# Patient Record
Sex: Male | Born: 1960 | Race: White | Hispanic: No | Marital: Married | Smoking: Never smoker
Health system: Southern US, Community
[De-identification: ages and names within clinical notes are randomized; demographics above are authoritative.]

---

## 2007-02-09 ENCOUNTER — Ambulatory Visit: Payer: Self-pay | Admitting: Urology

## 2008-02-06 ENCOUNTER — Ambulatory Visit: Payer: Self-pay | Admitting: Urology

## 2009-02-11 ENCOUNTER — Ambulatory Visit: Payer: Self-pay | Admitting: Urology

## 2011-02-09 ENCOUNTER — Ambulatory Visit: Payer: Self-pay | Admitting: Urology

## 2012-06-30 ENCOUNTER — Emergency Department: Payer: Self-pay | Admitting: *Deleted

## 2013-05-13 DIAGNOSIS — N529 Male erectile dysfunction, unspecified: Secondary | ICD-10-CM | POA: Insufficient documentation

## 2013-05-13 DIAGNOSIS — R82991 Hypocitraturia: Secondary | ICD-10-CM | POA: Insufficient documentation

## 2013-05-13 DIAGNOSIS — N138 Other obstructive and reflux uropathy: Secondary | ICD-10-CM | POA: Insufficient documentation

## 2013-05-13 DIAGNOSIS — N200 Calculus of kidney: Secondary | ICD-10-CM | POA: Insufficient documentation

## 2013-05-14 ENCOUNTER — Ambulatory Visit: Payer: Self-pay | Admitting: Urology

## 2015-12-03 DIAGNOSIS — Z1211 Encounter for screening for malignant neoplasm of colon: Secondary | ICD-10-CM | POA: Insufficient documentation

## 2016-08-30 ENCOUNTER — Other Ambulatory Visit: Payer: Self-pay | Admitting: Neurology

## 2016-08-30 DIAGNOSIS — R251 Tremor, unspecified: Secondary | ICD-10-CM

## 2016-09-09 ENCOUNTER — Ambulatory Visit: Payer: Self-pay

## 2016-09-20 ENCOUNTER — Ambulatory Visit
Admission: RE | Admit: 2016-09-20 | Discharge: 2016-09-20 | Disposition: A | Discharge status: Home / Self Care | Payer: BLUE CROSS/BLUE SHIELD | Source: Ambulatory Visit | Attending: Neurology | Admitting: Neurology

## 2016-09-20 DIAGNOSIS — R251 Tremor, unspecified: Secondary | ICD-10-CM | POA: Diagnosis not present

## 2016-09-20 MED ORDER — GADOBENATE DIMEGLUMINE 529 MG/ML IV SOLN
18.0000 mL | Freq: Once | INTRAVENOUS | Status: AC | PRN
  Administered 2016-09-20: 18 mL via INTRAVENOUS

## 2017-02-01 DIAGNOSIS — M79673 Pain in unspecified foot: Secondary | ICD-10-CM | POA: Insufficient documentation

## 2017-02-17 DIAGNOSIS — G20 Parkinson's disease: Secondary | ICD-10-CM | POA: Insufficient documentation

## 2017-06-14 DIAGNOSIS — G20 Parkinson's disease: Secondary | ICD-10-CM | POA: Insufficient documentation

## 2017-07-19 ENCOUNTER — Ambulatory Visit (INDEPENDENT_AMBULATORY_CARE_PROVIDER_SITE_OTHER): Payer: BLUE CROSS/BLUE SHIELD | Admitting: Podiatry

## 2017-07-19 ENCOUNTER — Encounter: Payer: Self-pay | Admitting: Podiatry

## 2017-07-19 ENCOUNTER — Ambulatory Visit (INDEPENDENT_AMBULATORY_CARE_PROVIDER_SITE_OTHER): Payer: BLUE CROSS/BLUE SHIELD

## 2017-07-19 VITALS — BP 125/86 | HR 65 | Temp 98.2°F | Resp 14

## 2017-07-19 DIAGNOSIS — M7751 Other enthesopathy of right foot: Secondary | ICD-10-CM | POA: Diagnosis not present

## 2017-07-19 DIAGNOSIS — M779 Enthesopathy, unspecified: Secondary | ICD-10-CM | POA: Diagnosis not present

## 2017-07-19 DIAGNOSIS — S92901K Unspecified fracture of right foot, subsequent encounter for fracture with nonunion: Secondary | ICD-10-CM | POA: Diagnosis not present

## 2017-07-19 DIAGNOSIS — M778 Other enthesopathies, not elsewhere classified: Secondary | ICD-10-CM

## 2017-07-19 NOTE — Progress Notes (Addendum)
   Subjective:    Patient ID: Jordan Luna, male    DOB: 1961-09-16, 56 y.o.   MRN: [id number]  HPI: He presents today with his wife with a chief complaint of a painful right foot times the past 7-8 months. He states that toward the end of December 2017 he went on an excessively long run and developed pain in the first metatarsal in the midfoot area right side. He states that he continue worsen over a few days and he visited the emergency walk-in clinic. X-rays were taken demonstrating a stress fracture at the base of the first metatarsal. He was placed in a Cam Walker for 6-8 weeks and then was instructed to follow-up with orthopedics. He saw orthopedics who x-rayed the foot again demonstrating that it was probably not a stress fracture in the first place. He then continued his exercise regimen and the pain continued to nagging. He states that he takes ibuprofen for pain as prescribed by orthopedics. He also saw another doctor at her no clinic. He has been recently diagnosed with Parkinson's disease.    Review of Systems  Neurological: Positive for tremors.  All other systems reviewed and are negative.      Objective:   Physical Exam: He presents today with an antalgic gait right. I have reviewed his past medical history medications allergy surgery social history and review of systems. No apparent distress. Pulses are strongly palpable. Neurologic sensorium is intact. Deep tendon reflexes are intact. Muscle strength is normal. Orthopedic evaluation demonstrates an obvious flatfoot deformity on the right side as compared to the left side. He states that he used to not be like this. He does have a mild elevated first metatarsal. He does have pain around a hypertrophic first metatarsal medial cuneiform joint. This is painful on palpation. He has good range of motion of the rear foot and the forefoot.  Radiographs taken today demonstrated osseously mature individual with what appears to be an old  fracture at the base of the wrist metatarsal with a possible nonunion. There is also hypertrophic bone growth very much similar to a hypertrophic nonunion is bone growth is dorsal medially and plantar. This does appear to be a fracture in particular and intra-articular fracture. No other osseous abnormalities are noted.        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  Plan: Discussed etiology pathology conservative versus surgical therapies at this point we are requesting an MRI to evaluate hypertrophic nonunion. This is a surgical consideration since all conservative therapies have failed to render this patient asymptomatic.  Once the MRI is performed we will have him back to discuss the results at that time we will start him on prednisone and perform an injection to the first metatarsal medial cuneiform joint area. He will be leaving for [place] in early September and November [place].

## 2017-08-01 ENCOUNTER — Telehealth: Payer: Self-pay | Admitting: Podiatry

## 2017-08-01 NOTE — Telephone Encounter (Signed)
I saw Dr. Al CorpusHyatt in the [place] office about two weeks ago. He said the next plan of action was for me to get an MRI. I have not heard anything from anyone and I expected it to be done by now. Please call me back by the end of the day at [phone]. Thank you.

## 2017-08-03 NOTE — Telephone Encounter (Signed)
Returned patient's call, left message informing him that I have been trying to get intouch with his insurance company and have left several messages for [employer] [place] to call back with no response.  I informed him that I will call him once Berkley Harveyauth has been obtained.

## 2017-08-10 ENCOUNTER — Telehealth: Payer: Self-pay

## 2017-08-10 NOTE — Telephone Encounter (Signed)
Per [employer] [place], MRI has been approved from 08/10/17 to 08/25/17   Auth # [id number] Patient was notified via voice mail to call [facility] scheduling and [facility].

## 2017-08-24 ENCOUNTER — Ambulatory Visit
Admission: RE | Admit: 2017-08-24 | Discharge: 2017-08-24 | Disposition: A | Discharge status: Home / Self Care | Payer: BLUE CROSS/BLUE SHIELD | Source: Ambulatory Visit | Attending: Podiatry | Admitting: Podiatry

## 2017-08-24 DIAGNOSIS — M19071 Primary osteoarthritis, right ankle and foot: Secondary | ICD-10-CM | POA: Insufficient documentation

## 2017-08-24 DIAGNOSIS — M779 Enthesopathy, unspecified: Secondary | ICD-10-CM | POA: Insufficient documentation

## 2017-08-24 DIAGNOSIS — M65871 Other synovitis and tenosynovitis, right ankle and foot: Secondary | ICD-10-CM | POA: Insufficient documentation

## 2017-08-24 DIAGNOSIS — R609 Edema, unspecified: Secondary | ICD-10-CM | POA: Diagnosis not present

## 2017-08-24 DIAGNOSIS — S92901K Unspecified fracture of right foot, subsequent encounter for fracture with nonunion: Secondary | ICD-10-CM | POA: Diagnosis present

## 2017-08-30 ENCOUNTER — Ambulatory Visit (INDEPENDENT_AMBULATORY_CARE_PROVIDER_SITE_OTHER): Payer: BLUE CROSS/BLUE SHIELD | Admitting: Podiatry

## 2017-08-30 ENCOUNTER — Encounter: Payer: Self-pay | Admitting: Podiatry

## 2017-08-30 DIAGNOSIS — M779 Enthesopathy, unspecified: Secondary | ICD-10-CM

## 2017-08-30 MED ORDER — MELOXICAM 15 MG PO TABS: 15.0000 mg | ORAL_TABLET | Freq: Every day | ORAL | 3 refills | Status: DC

## 2017-08-30 MED ORDER — METHYLPREDNISOLONE 4 MG PO TBPK: ORAL_TABLET | ORAL | 0 refills | Status: DC

## 2017-08-30 NOTE — Progress Notes (Signed)
He presents today for follow-up of his MRI report.  Objective: Vital signs are stable he is alert and oriented 3. Pulses are palpable. He still has pain on palpation of the first metatarsal medial cuneiforms joint. Radiographs confirm osteoarthritic change of this joint.  Assessment: Traumatic degenerative joint disease first metatarsal medial cuneiform joint right foot.  Plan: At this point due to the capsulitis from the degenerative joint disease I injected around the joint today with Kenalog and local anesthetic. Placed him on a Medrol Dosepak to be followed by meloxicam. We discussed appropriate shoe gear stretching exercises ice therapy. Discussed need for surgical intervention consisting of a Lapidus fusion. More the likely will also have to perform a second metatarsal osteotomy to slide back little bit since it does appear to be long. I will follow-up with him in a month after he returns from [place].

## 2017-10-11 ENCOUNTER — Ambulatory Visit: Payer: BLUE CROSS/BLUE SHIELD | Admitting: Podiatry

## 2017-10-23 ENCOUNTER — Ambulatory Visit: Payer: BLUE CROSS/BLUE SHIELD | Admitting: Podiatry

## 2017-10-23 ENCOUNTER — Encounter: Payer: Self-pay | Admitting: Podiatry

## 2017-10-23 DIAGNOSIS — M19079 Primary osteoarthritis, unspecified ankle and foot: Secondary | ICD-10-CM

## 2017-10-23 DIAGNOSIS — M779 Enthesopathy, unspecified: Secondary | ICD-10-CM

## 2017-10-23 NOTE — Progress Notes (Signed)
He presents today states that his foot is doing very well he states this just recently started to bother him again as he refers to the area of arthritis of the first metatarsal medial cuneiform articulation.  Objective: No pain on palpation or range of motion of the articular site in question.  Assessment: Resolving capsulitis osteoarthritic changes still prominent.  Plan: Follow up with him in January for another injection if necessary.

## 2017-11-16 ENCOUNTER — Other Ambulatory Visit: Payer: Self-pay

## 2017-11-16 MED ORDER — MELOXICAM 15 MG PO TABS: 15.0000 mg | ORAL_TABLET | Freq: Every day | ORAL | 1 refills | Status: DC

## 2017-11-16 NOTE — Telephone Encounter (Signed)
Pharmacy refill request for 90 day supply of Meloxicam.  Per Dr. Al CorpusHyatt, ok to refill for 90 days.  New rx has been sent to pharmacy

## 2017-12-06 ENCOUNTER — Ambulatory Visit: Payer: BLUE CROSS/BLUE SHIELD | Admitting: Podiatry

## 2017-12-06 ENCOUNTER — Encounter: Payer: Self-pay | Admitting: Podiatry

## 2017-12-06 DIAGNOSIS — M779 Enthesopathy, unspecified: Secondary | ICD-10-CM

## 2017-12-06 DIAGNOSIS — M7751 Other enthesopathy of right foot: Secondary | ICD-10-CM

## 2017-12-06 DIAGNOSIS — M778 Other enthesopathies, not elsewhere classified: Secondary | ICD-10-CM

## 2017-12-06 NOTE — Progress Notes (Signed)
He presents today for follow-up of foot pain states that is about the same the last injection is starting to wear off at this point so would like to consider another injection if possible.  Objective: Vital signs are stable he is alert and oriented x3 traumatic injury to the first metatarsal medial cuneiform joint has left arthritis and capsulitis present.  Assessment: Capsulitis osteoarthritis first metatarsal cuneiform joint.  Plan: Injected the first intermetatarsal space proximally today with 20 mg of Kenalog 5 mg of Marcaine to the point of maximal tenderness after sterile Betadine skin prep and verbal confirmation.  I will follow-up with him on an as-needed basis or in April.

## 2018-02-28 ENCOUNTER — Ambulatory Visit: Payer: BLUE CROSS/BLUE SHIELD | Admitting: Podiatry

## 2018-02-28 ENCOUNTER — Encounter: Payer: Self-pay | Admitting: Podiatry

## 2018-02-28 DIAGNOSIS — M779 Enthesopathy, unspecified: Secondary | ICD-10-CM | POA: Diagnosis not present

## 2018-02-28 DIAGNOSIS — M778 Other enthesopathies, not elsewhere classified: Secondary | ICD-10-CM

## 2018-02-28 NOTE — Progress Notes (Signed)
He presents today for follow-up of pain to the arthritic joint of the first metatarsal medial cuneiform joint as well as to his second metatarsal phalangeal joint.  He states the injection has seemingly not lasted as long as the initial injection did.  He states that he is leaving for [place] this afternoon and would like another injection but knows that he is going to have to have surgery to correct this sometime in late summer or early fall.  Objective: Vital signs are stable he is alert and oriented x3.  Pulses are palpable.  Neurologic sensorium is intact deep tendon reflexes are intact muscle strength is normal symmetrical.  Osteoarthritic joint first metatarsal medial cuneiform joint is painful palpation as is his hammertoe deformity and contracture of the second metatarsophalangeal joint.  This is reducible in nature.  Assessment: Painful capsulitis second metatarsophalangeal joint right capsulitis and osteoarthritis first metatarsal medial cuneiform joint right.  Plan: Discussed etiology pathology conservative versus surgical therapies.  After sterile Betadine skin prep and then wiped with alcohol I injected 2 mg of dexamethasone and local anesthetic into the second metatarsophalangeal joint.  I also injected peri-articularly 20 mg of Kenalog and 5 mg Marcaine around the first metatarsal medial cuneiform joint.  This should alleviate his symptoms and I will follow-up with him in the near future for surgical consideration.

## 2018-03-07 ENCOUNTER — Ambulatory Visit: Payer: BLUE CROSS/BLUE SHIELD | Admitting: Podiatry

## 2018-05-15 ENCOUNTER — Other Ambulatory Visit: Payer: Self-pay | Admitting: Podiatry

## 2018-06-13 ENCOUNTER — Ambulatory Visit: Payer: BLUE CROSS/BLUE SHIELD | Admitting: Podiatry

## 2018-06-13 ENCOUNTER — Encounter: Payer: Self-pay | Admitting: Podiatry

## 2018-06-13 DIAGNOSIS — M779 Enthesopathy, unspecified: Principal | ICD-10-CM

## 2018-06-13 DIAGNOSIS — M7751 Other enthesopathy of right foot: Secondary | ICD-10-CM | POA: Diagnosis not present

## 2018-06-13 DIAGNOSIS — M778 Other enthesopathies, not elsewhere classified: Secondary | ICD-10-CM

## 2018-06-13 NOTE — Progress Notes (Signed)
He presents today for follow-up of his capsulitis and osteoarthritis first metatarsal medial cuneiform joint as well as hammertoe deformity and capsulitis of the second metatarsal phalangeal joint on the right foot.  He states that the second shot at the metatarsal phalangeal joint really made a difference.  He states that he would like to consider doing this again.  Objective: Vital signs are stable he is alert and oriented x3.  Pulses are palpable.  He has pain on palpation and range of motion of the first metatarsal medial cuneiform joint as well as the second metatarsal phalangeal joint on the right foot.  Assessment: Capsulitis osteoarthritis with hammertoe deformity.  Plan: Injected 2 injections today milligrams Kenalog 5 mg Marcaine and 2 mg of dexamethasone and local anesthetic after sterile Betadine skin prep to the point maximal tenderness.  Tolerated procedure well without complications.  Follow-up with him about 3 months prior to his daughter's wedding.

## 2018-08-10 ENCOUNTER — Other Ambulatory Visit: Payer: Self-pay | Admitting: Podiatry

## 2018-09-10 ENCOUNTER — Encounter: Payer: Self-pay | Admitting: Podiatry

## 2018-09-10 ENCOUNTER — Ambulatory Visit: Payer: BLUE CROSS/BLUE SHIELD | Admitting: Podiatry

## 2018-09-10 DIAGNOSIS — M778 Other enthesopathies, not elsewhere classified: Secondary | ICD-10-CM

## 2018-09-10 DIAGNOSIS — M779 Enthesopathy, unspecified: Secondary | ICD-10-CM

## 2018-09-10 MED ORDER — MELOXICAM 15 MG PO TABS: 15.0000 mg | ORAL_TABLET | Freq: Every day | ORAL | 3 refills | Status: DC

## 2018-09-10 NOTE — Progress Notes (Signed)
He presents today for follow-up of capsulitis second metatarsal phalangeal joint of the right foot as well as the first tarsometatarsal joint of the right foot.  He has his son's wedding in 3 weeks.  Objective: Vital signs are stable alert and oriented x3.  Hammertoe deformity second metatarsal phalangeal joint with tenderness on palpation and end range of motion of the second metatarsal phalangeal joint arthritic first tarsometatarsal joint right foot tender on palpation.  Assessment: First metatarsal medial cuneiform joint arthritis right foot with capsulitis and hammertoe deformity second right.  Plan: Discussed etiology pathology and surgical therapies this point time injected the area with 2 mg of dexamethasone local anesthetic after sterile Betadine skin prep second metatarsophalangeal joint of the right foot into the first tarsometatarsal joint with Kenalog and local anesthetic.  Tolerated procedure well without complications we will follow-up with him in the near future for surgical intervention if necessary.

## 2018-12-08 ENCOUNTER — Other Ambulatory Visit: Payer: Self-pay | Admitting: Podiatry

## 2018-12-10 ENCOUNTER — Ambulatory Visit (INDEPENDENT_AMBULATORY_CARE_PROVIDER_SITE_OTHER): Payer: BLUE CROSS/BLUE SHIELD | Admitting: Podiatry

## 2018-12-10 ENCOUNTER — Encounter: Payer: Self-pay | Admitting: Podiatry

## 2018-12-10 DIAGNOSIS — M779 Enthesopathy, unspecified: Secondary | ICD-10-CM | POA: Diagnosis not present

## 2018-12-10 DIAGNOSIS — M778 Other enthesopathies, not elsewhere classified: Secondary | ICD-10-CM

## 2018-12-10 NOTE — Progress Notes (Signed)
He presents today for follow-up of capsulitis to the dorsal aspect of the foot and the second metatarsal phalangeal joint of the right foot.  States that is not as bad as it has been in the past.  Objective: Vital signs are stable alert and oriented x3.  Has some tenderness of the tibialis anterior near its insertion site and at the level of the ankle.  Also has tenderness and pain around the talonavicular joint of the first tarsometatarsal joint and the second metatarsal phalangeal joint with hammertoe deformity second right.  Assessment: Capsulitis tendinitis.  Plan: I injected the area today with Kenalog 20 mg Kenalog 5 mg Marcaine point of maximal tenderness of the TMT and the MTPJ.  I will follow-up with him in the near future for either another injection or to discuss surgery at that time an MRI of the right foot will be necessary to evaluate for talonavicular subtalar joint and TMT.

## 2019-03-14 ENCOUNTER — Encounter: Payer: Self-pay | Admitting: Podiatry

## 2019-03-14 ENCOUNTER — Other Ambulatory Visit: Payer: Self-pay

## 2019-03-14 ENCOUNTER — Ambulatory Visit (INDEPENDENT_AMBULATORY_CARE_PROVIDER_SITE_OTHER): Payer: BLUE CROSS/BLUE SHIELD | Admitting: Podiatry

## 2019-03-14 VITALS — Temp 98.0°F

## 2019-03-14 DIAGNOSIS — M779 Enthesopathy, unspecified: Principal | ICD-10-CM

## 2019-03-14 DIAGNOSIS — M778 Other enthesopathies, not elsewhere classified: Secondary | ICD-10-CM

## 2019-03-14 DIAGNOSIS — M7751 Other enthesopathy of right foot: Secondary | ICD-10-CM

## 2019-03-14 NOTE — Progress Notes (Signed)
He presents today for follow-up of capsulitis of the second metatarsophalangeal joint and the first TMT joint.  He states that is still hurting.  Gets all have to wait until this virus has run its course before we can get a CT scan MRI and surgery.  Objective: Vital signs are stable he is alert and oriented x3 contracted digits 2 through 5 of the right foot resulting in capsulitis of the second metatarsal phalangeal joint.  Hypertrophic arthropathy to the first TMT with capsulitis.  Assessment: Capsulitis arthropathy first TMT and second MTPJ.  Plan: After sterile Betadine skin prep I injected 10 mg around the second metatarsal phalangeal joint Kenalog and local anesthetic.  I then injected around the first metatarsal cuneiform joint 20 mg Kenalog with 10 mg of local anesthetic.

## 2019-03-20 ENCOUNTER — Ambulatory Visit: Payer: BLUE CROSS/BLUE SHIELD | Admitting: Podiatry

## 2019-05-30 ENCOUNTER — Other Ambulatory Visit: Payer: Self-pay | Admitting: Podiatry

## 2019-06-19 ENCOUNTER — Other Ambulatory Visit: Payer: Self-pay

## 2019-06-19 ENCOUNTER — Ambulatory Visit: Payer: BLUE CROSS/BLUE SHIELD | Admitting: Podiatry

## 2019-06-19 ENCOUNTER — Encounter: Payer: Self-pay | Admitting: Podiatry

## 2019-06-19 VITALS — Temp 97.6°F

## 2019-06-19 DIAGNOSIS — M779 Enthesopathy, unspecified: Secondary | ICD-10-CM | POA: Diagnosis not present

## 2019-06-19 DIAGNOSIS — M778 Other enthesopathies, not elsewhere classified: Secondary | ICD-10-CM

## 2019-06-19 NOTE — Progress Notes (Signed)
He presents today for follow-up of his osteoarthritis first metatarsal phalangeal joint of the right foot second metatarsal capsulitis with hammertoe deformity.  Assessment: Chronic capsulitis chronic hammertoe deformity.  Plan: After Betadine skin prep I injected 20 mg Kenalog 5 mg Marcaine point maximal tenderness around the first TMT.  Also second metatarsal phalangeal joint right.  Discussed the need for surgical intervention may be this fall follow-up with him in October.

## 2019-07-03 IMAGING — MR MR HEEL *R* W/O CM
5 series · 40 of 40 positions shown · non-contrast
Comparison: Radiographs 07/19/2009

CLINICAL DATA: Foot pain and swelling. Problems since November 2016.
Patient is runner.

EXAM:
MR OF THE RIGHT HEEL WITHOUT CONTRAST; MRI OF THE RIGHT FOREFOOT
WITHOUT CONTRAST
TECHNIQUE: Multiplanar, multisequence MR imaging of the ankle was performed. No
intravenous contrast was administered.

[Series 3: PD fat-sat · axial · 3.0mm · 0.74mm/px · z∈[-42,+97]mm · 8 of 43 slices shown]
[im 1/43]
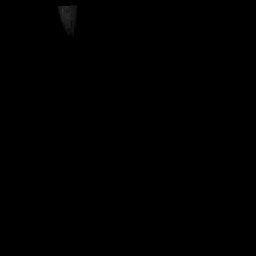
[im 7/43]
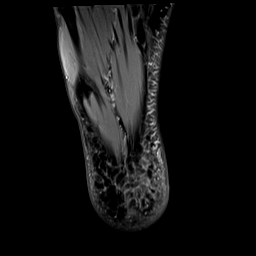
[im 13/43]
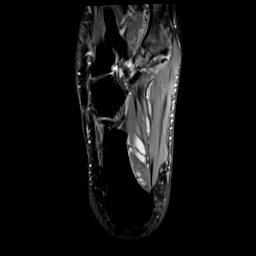
[im 19/43]
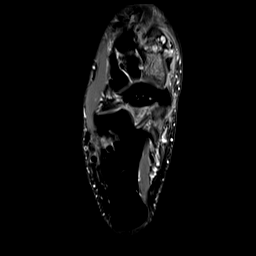
[im 25/43]
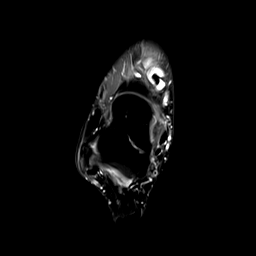
[im 31/43]
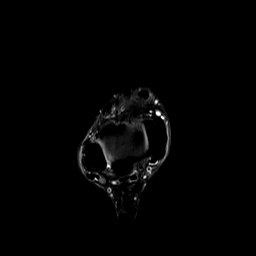
[im 37/43]
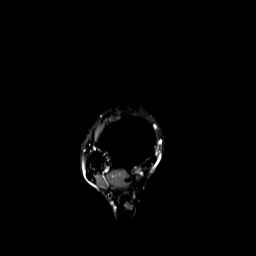
[im 43/43]
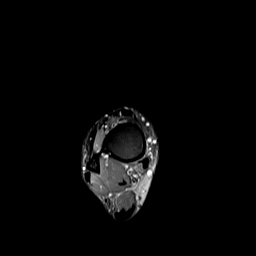

[Series 4: T2 fat-sat · axial · 3.0mm · 0.74mm/px · z∈[-42,+97]mm · 9 of 43 slices shown (1 of 3)]
[im 1/43]
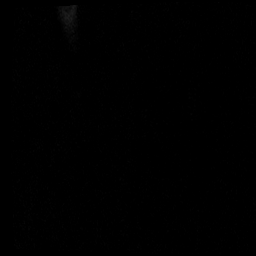
[im 6/43]
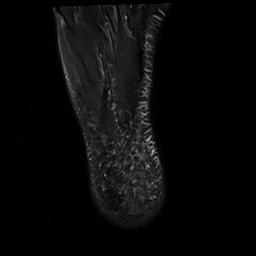
[im 11/43]
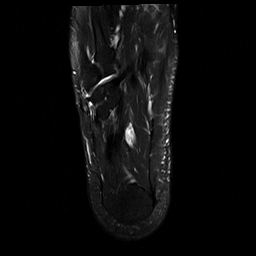
[im 16/43]
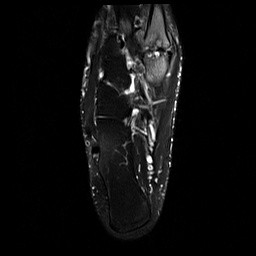
[im 22/43]
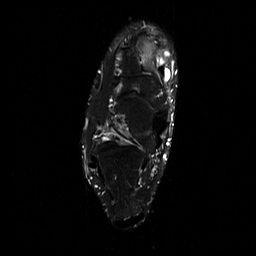
[im 27/43]
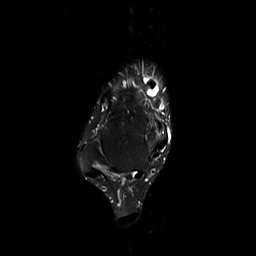
[im 32/43]
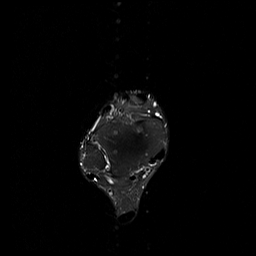
[im 37/43]
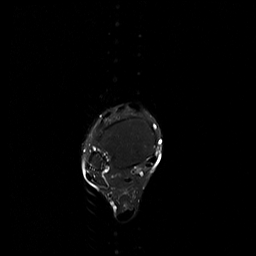
[im 43/43]
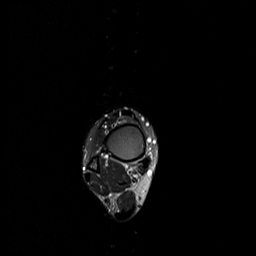

[Series 5: T2 fat-sat · coronal · 3.0mm · 0.70mm/px · 11 of 52 slices shown (2 of 3)]
[im 1/52]
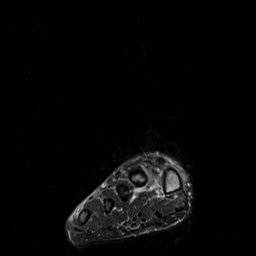
[im 6/52]
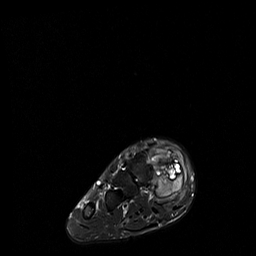
[im 11/52]
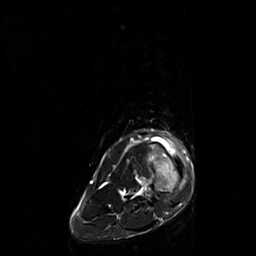
[im 16/52]
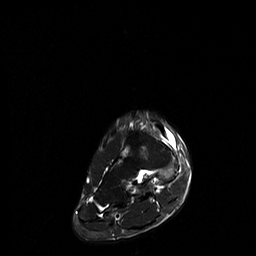
[im 21/52]
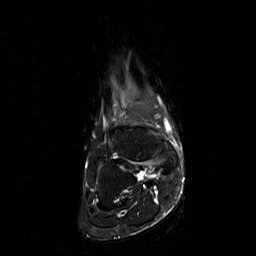
[im 26/52]
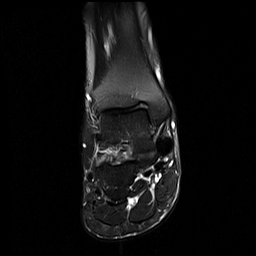
[im 31/52]
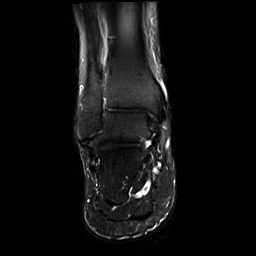
[im 36/52]
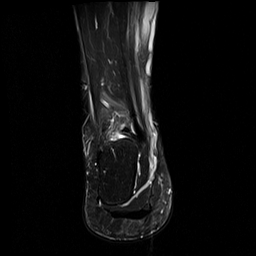
[im 41/52]
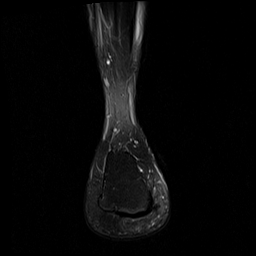
[im 46/52]
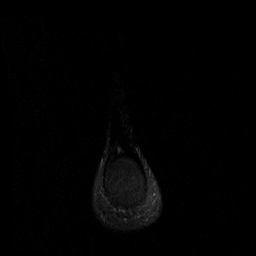
[im 52/52]
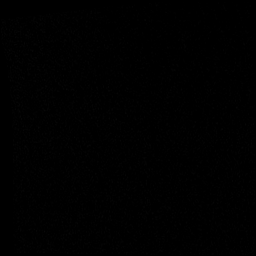

[Series 6: T1 · sagittal · 3.0mm · 0.59mm/px · 6 of 29 slices shown]
[im 1/29]
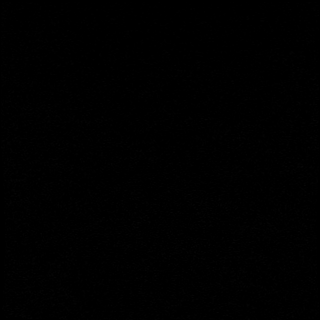
[im 6/29]
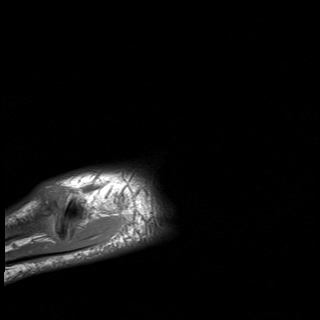
[im 12/29]
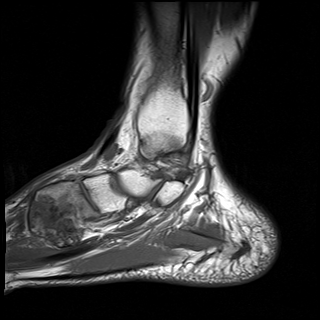
[im 17/29]
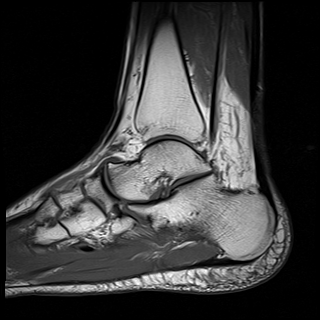
[im 23/29]
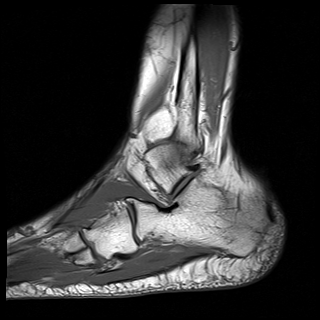
[im 29/29]
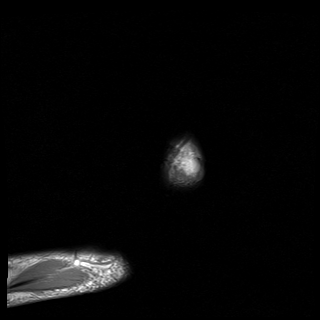

[Series 7: T2 fat-sat · sagittal · 3.0mm · 0.70mm/px · 6 of 29 slices shown (3 of 3)]
[im 1/29]
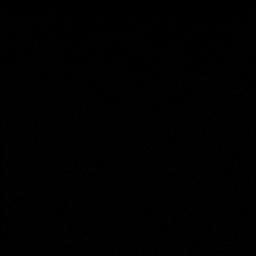
[im 6/29]
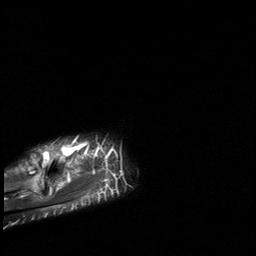
[im 12/29]
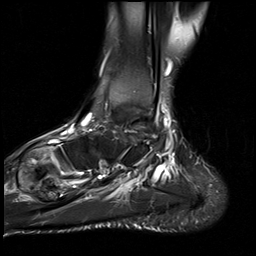
[im 17/29]
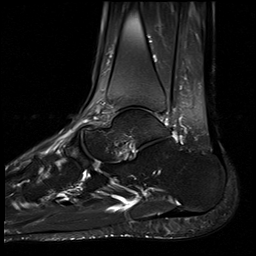
[im 23/29]
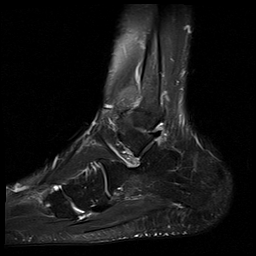
[im 29/29]
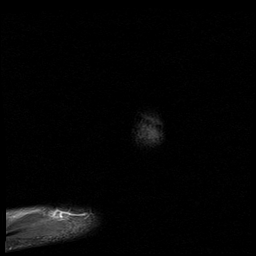

[40 of 40 positions shown; findings below may reference images not displayed]

FINDINGS: TENDONS

Peroneal: Significant tendinopathy and intrasubstance tearing of the
peroneus brevis tendon distally.

Posteromedial: Intact.

Anterior: Intact. Significant tenosynovitis involving the anterior
tibialis tendon.

Achilles: Normal

Plantar Fascia: Intact

LIGAMENTS

Lateral: Intact

Medial: Intact

CARTILAGE

Ankle Joint: No significant degenerative changes. No cartilage
defects or osteochondral lesion. No joint effusion.

Subtalar Joints/Sinus Tarsi: The subtalar joints are maintained. No
significant degenerative changes or joint effusions. There are mild
degenerative changes noted at the talonavicular joint with early
dorsal spurring and mild subchondral cystic change.

Small amount of fluid and edema in the sinus tarsi E but the
cervical and interosseous ligaments are intact and the spring
ligament is intact.

Bones: Severe degenerative changes at the first metatarsal
articulation with medial cuneiform. There is significant joint space
narrowing, osteophytic spurring, subchondral cystic change, marked
marrow edema and synovitis. This could be posttraumatic arthritis or
possibly a monoarticular arthropathy such as gout or CPPD.

Some associated stress edema noted in second metatarsal base but no
metatarsal stress fracture. The forefoot bony structures are intact.

Other: The foot musculature appears normal. No muscle tear or
myositis. Mild degenerative changes at first metatarsal phalangeal
joint.
IMPRESSION: 1. Severe degenerative changes at the first metatarsal/medial
cuneiform joint as discussed above. There is also associated stress
edema in the base of the second metatarsal no stress fracture.
2. Tenosynovitis involving the anterior tibialis tendon.
3. Significant tendinopathy and intrasubstance tearing of distal
peroneus brevis tendon.
4. Intact medial and lateral ankle ligaments tendons.

## 2019-09-11 ENCOUNTER — Ambulatory Visit (INDEPENDENT_AMBULATORY_CARE_PROVIDER_SITE_OTHER): Payer: BC Managed Care – PPO

## 2019-09-11 ENCOUNTER — Ambulatory Visit: Payer: BC Managed Care – PPO | Admitting: Podiatry

## 2019-09-11 ENCOUNTER — Encounter: Payer: Self-pay | Admitting: Podiatry

## 2019-09-11 ENCOUNTER — Other Ambulatory Visit: Payer: Self-pay

## 2019-09-11 DIAGNOSIS — M2011 Hallux valgus (acquired), right foot: Secondary | ICD-10-CM | POA: Diagnosis not present

## 2019-09-11 DIAGNOSIS — M2041 Other hammer toe(s) (acquired), right foot: Secondary | ICD-10-CM

## 2019-09-11 NOTE — Patient Instructions (Signed)
Pre-Operative Instructions  Congratulations, you have decided to take an important step towards improving your quality of life.  You can be assured that the doctors and staff at [facility] will be with you every step of the way.  Here are some important things you should know:  1. Plan to be at the surgery center/hospital at least 1 (one) hour prior to your scheduled time, unless otherwise directed by the surgical center/hospital staff.  You must have a responsible adult accompany you, remain during the surgery and drive you home.  Make sure you have directions to the surgical center/hospital to ensure you arrive on time. 2. If you are having surgery at [facility] or [facility] hospitals, you will need a copy of your medical history and physical form from your family physician within one month prior to the date of surgery. We will give you a form for your primary physician to complete.  3. We make every effort to accommodate the date you request for surgery.  However, there are times where surgery dates or times have to be moved.  We will contact you as soon as possible if a change in schedule is required.   4. No aspirin/ibuprofen for one week before surgery.  If you are on aspirin, any non-steroidal anti-inflammatory medications (Mobic, Aleve, Ibuprofen) should not be taken seven (7) days prior to your surgery.  You make take Tylenol for pain prior to surgery.  5. Medications - If you are taking daily heart and blood pressure medications, seizure, reflux, allergy, asthma, anxiety, pain or diabetes medications, make sure you notify the surgery center/hospital before the day of surgery so they can tell you which medications you should take or avoid the day of surgery. 6. No food or drink after midnight the night before surgery unless directed otherwise by surgical center/hospital staff. 7. No alcoholic beverages 24-hours prior to surgery.  No smoking 24-hours prior or 24-hours after  surgery. 8. Wear loose pants or shorts. They should be loose enough to fit over bandages, boots, and casts. 9. Don't wear slip-on shoes. Sneakers are preferred. 10. Bring your boot with you to the surgery center/hospital.  Also bring crutches or a walker if your physician has prescribed it for you.  If you do not have this equipment, it will be provided for you after surgery. 11. If you have not been contacted by the surgery center/hospital by the day before your surgery, call to confirm the date and time of your surgery. 12. Leave-time from work may vary depending on the type of surgery you have.  Appropriate arrangements should be made prior to surgery with your employer. 13. Prescriptions will be provided immediately following surgery by your doctor.  Fill these as soon as possible after surgery and take the medication as directed. Pain medications will not be refilled on weekends and must be approved by the doctor. 14. Remove nail polish on the operative foot and avoid getting pedicures prior to surgery. 15. Wash the night before surgery.  The night before surgery wash the foot and leg well with water and the antibacterial soap provided. Be sure to pay special attention to beneath the toenails and in between the toes.  Wash for at least three (3) minutes. Rinse thoroughly with water and dry well with a towel.  Perform this wash unless told not to do so by your physician.  Enclosed: 1 Ice pack (please put in freezer the night before surgery)   1 Hibiclens skin cleaner     Pre-op instructions  If you have any questions regarding the instructions, please do not hesitate to call our office.  [place]: [address], [place], [place] [address] -- [phone]  [place]: [address] [place], [place] [address] -- [phone]  [place]: [address]  [place], [place] [address] -- [phone]   Website: [email or web address] 

## 2019-09-11 NOTE — Progress Notes (Signed)
He presents today for a surgical consult regarding his right foot.  States that I think is about time that we go ahead and get this foot taken care of.  He denies any changes in his past medical history medications allergies surgeries and social history.  Denies any further injury to the right foot.  States that is starting to affect his ability to perform his daily activities and needs to have this fixed so that he can get on with his wife.  Objective: Vital signs are stable alert and oriented x3 pulses are palpable.  I have reviewed his past medical history medications allergies surgery social history and review of systems.  Neurologic sensorium is intact deep tendon reflexes are intact muscle strength is normal symmetrical.  He has painful arthritic first TMT right foot.  With pes planus.  He also has painful second metatarsal phalangeal joint with completely dislocated second toe with hammertoe deformity.  He also has hammertoe deformity third and fourth digits of the right foot.  These are not rigid.  Radiographs taken today demonstrate significant osteoarthritic changes of the first TMT significantly elongated second and third metatarsals with complete dislocation of the second toe right.  Assessment: Osteoarthritis first TMT elongated plantarflexed second and third metatarsals of the right foot hammertoes 2 3 and 4 of the right foot.  Plan: Discussed etiology pathology conservative versus surgical therapies.  At this point consented him for a first metatarsal medial cuneiform fusion with shortening of the second and third metatarsals.  Also a hammertoe deformity with repair #2 #3 #4 with screws.  We will also apply cast.  He is amenable to this and understands the possible consequences which we discussed today in great detail consisting of postop pain bleeding swelling infection loss of digit loss of limb loss of life possible need for further surgery.  We provided him with information regarding the  surgery center anesthesia group and information for the morning of surgery.  I will follow-up with him at that time.

## 2019-09-12 ENCOUNTER — Telehealth: Payer: Self-pay | Admitting: Podiatry

## 2019-09-12 NOTE — Telephone Encounter (Signed)
I'm calling to schedule my surgery with Dr. Milinda Pointer. I told the pt Dr. Milinda Pointer does surgeries on Friday's and asked if there was a certain day he was looking for. Pt requested Friday 10/18/2019. I told pt to go ahead and register online with the surgical center and that someone from there would call him a day or two before surgery to let him know what time to arrive.

## 2019-09-23 ENCOUNTER — Telehealth: Payer: Self-pay | Admitting: Podiatry

## 2019-09-23 NOTE — Telephone Encounter (Signed)
DOS: 10/18/2019 SURGICAL PROCEDURES: Lapidus Procedure Including Bunionectomy MB(86754), Metatarsal Osteotomy 2nd GB(20100), Hammertoe Repair 2nd, 3rd, 4th FH(21975), Cast Application Rt DX CODES: Hallux Valgus Rt(M20.11), Plantar Flexed Met.(M21.549), Hammertoe(M20.41)  Member Information   Member Number:  [id number]  Policy Effective : 09/40/7680  -  11/29/2019   Name: Jordan Luna  Date of Birth: 01-01-1961  Member Liability Summary       In-Network   Max Per Benefit Period Year-to-Date Remaining     CoInsurance         Deductible $8,000.00 $5,249.23     Out-Of-Pocket 3 $5,050.00 $4,142.00 3 Out-of-Pocket includes copay, deductible, and coinsurance.   Hospital - [facility]      In-Network Copay Coinsurance Authorization Required Not Applicable 88%  No Provider: SEE PROVIDER SPECIALTY Contact Name:  [facility]

## 2019-10-17 ENCOUNTER — Other Ambulatory Visit: Payer: Self-pay | Admitting: Podiatry

## 2019-10-17 MED ORDER — ONDANSETRON HCL 4 MG PO TABS: 4.0000 mg | ORAL_TABLET | Freq: Three times a day (TID) | ORAL | 0 refills | Status: DC | PRN

## 2019-10-17 MED ORDER — OXYCODONE-ACETAMINOPHEN 10-325 MG PO TABS: 1.0000 | ORAL_TABLET | Freq: Three times a day (TID) | ORAL | 0 refills | Status: DC | PRN

## 2019-10-17 MED ORDER — CEPHALEXIN 500 MG PO CAPS: 500.0000 mg | ORAL_CAPSULE | Freq: Three times a day (TID) | ORAL | 0 refills | Status: DC

## 2019-10-18 ENCOUNTER — Encounter: Payer: Self-pay | Admitting: Podiatry

## 2019-10-18 DIAGNOSIS — M2021 Hallux rigidus, right foot: Secondary | ICD-10-CM

## 2019-10-18 DIAGNOSIS — M2041 Other hammer toe(s) (acquired), right foot: Secondary | ICD-10-CM

## 2019-10-18 DIAGNOSIS — M21541 Acquired clubfoot, right foot: Secondary | ICD-10-CM

## 2019-10-21 ENCOUNTER — Encounter: Payer: BC Managed Care – PPO | Admitting: Podiatry

## 2019-10-22 ENCOUNTER — Telehealth: Payer: Self-pay | Admitting: Podiatry

## 2019-10-22 ENCOUNTER — Telehealth: Payer: Self-pay | Admitting: *Deleted

## 2019-10-22 ENCOUNTER — Other Ambulatory Visit: Payer: Self-pay | Admitting: Podiatry

## 2019-10-22 MED ORDER — OXYCODONE-ACETAMINOPHEN 10-325 MG PO TABS: 1.0000 | ORAL_TABLET | Freq: Three times a day (TID) | ORAL | 0 refills | Status: AC | PRN

## 2019-10-22 NOTE — Telephone Encounter (Signed)
I informed pt of Dr. Stephenie Acres orders for the pain medication, sent to [facility].

## 2019-10-22 NOTE — Telephone Encounter (Signed)
Pt called requesting a refill of the pain medications.

## 2019-10-22 NOTE — Telephone Encounter (Signed)
Hey this pt called to see if hyatt would refill his pain meds before the holiday weekend DOS11/20/2020

## 2019-10-22 NOTE — Telephone Encounter (Signed)
DONE

## 2019-10-28 ENCOUNTER — Other Ambulatory Visit: Payer: Self-pay

## 2019-10-28 ENCOUNTER — Ambulatory Visit (INDEPENDENT_AMBULATORY_CARE_PROVIDER_SITE_OTHER): Payer: BC Managed Care – PPO

## 2019-10-28 ENCOUNTER — Encounter: Payer: Self-pay | Admitting: Podiatry

## 2019-10-28 ENCOUNTER — Ambulatory Visit (INDEPENDENT_AMBULATORY_CARE_PROVIDER_SITE_OTHER): Payer: BC Managed Care – PPO | Admitting: Podiatry

## 2019-10-28 VITALS — BP 126/74 | HR 95 | Temp 97.6°F

## 2019-10-28 DIAGNOSIS — Z9889 Other specified postprocedural states: Secondary | ICD-10-CM

## 2019-10-28 DIAGNOSIS — M2041 Other hammer toe(s) (acquired), right foot: Secondary | ICD-10-CM | POA: Diagnosis not present

## 2019-10-28 NOTE — Progress Notes (Signed)
Subjective:  Patient ID: Jordan Luna, male    DOB: Dec 08, 1960,  MRN: [id number]  Chief Complaint  Patient presents with  . Routine Post Op    pov#1 dos 11.20.2020 Lapidus Procedure Including Bunionectomy Rt, Metatarsal Osteotomy 2nd Rt, Hammertoe Repair 7,6,7 Rt, Cast Application Rt  "doing good"    58 y.o. male returns for post-op check.  Patient states that he is doing great.  He has not been taking his pain medication.  He states that it was difficult the first couple days but then pain completely decreased.  He denies any other acute complaints.  Patient presents with a knee scooter nonweightbearing to the right foot with a cast.  Review of Systems: Negative except as noted in the HPI. Denies N/V/F/Ch.  No past medical history on file.  Current Outpatient Medications:  .  carbidopa-levodopa (SINEMET IR) 25-100 MG tablet, carbidopa 25 mg-levodopa 100 mg tablet  TAKE 1/2 TABLET BY MOUTH 3 TIMES A DAY FOR 3 DAYS, THEN TAKE 1 TABLET BY MOUTH 3 TIMES A DAY, Disp: , Rfl:  .  cephALEXin (KEFLEX) 500 MG capsule, Take 1 capsule (500 mg total) by mouth 3 (three) times daily., Disp: 30 capsule, Rfl: 0 .  meloxicam (MOBIC) 15 MG tablet, TAKE 1 TABLET BY MOUTH EVERY DAY, Disp: 90 tablet, Rfl: 1 .  ondansetron (ZOFRAN) 4 MG tablet, Take 1 tablet (4 mg total) by mouth every 8 (eight) hours as needed., Disp: 20 tablet, Rfl: 0 .  oxyCODONE-acetaminophen (PERCOCET) 10-325 MG tablet, Take 1 tablet by mouth every 8 (eight) hours as needed for up to 7 days for pain., Disp: 21 tablet, Rfl: 0 .  POTASSIUM CHLORIDE ER PO, 2 Tabs by Misc.(Non-Drug; Combo Route) route twice a day. , Disp: , Rfl:  .  potassium citrate (UROCIT-K) 10 MEQ (1080 MG) SR tablet, Take by mouth., Disp: , Rfl:  .  VIVOTIF DR capsule, TAKE 1 CAPSULE BY ORAL ROUTE EVERY OTHER DAY FOR 8 DAYS, Disp: , Rfl: 0 .  zolpidem (AMBIEN) 10 MG tablet, Take 10 mg by mouth at bedtime as needed., Disp: , Rfl: 0  Social History   Tobacco Use   Smoking Status Never Smoker  Smokeless Tobacco Never Used    Allergies  Allergen Reactions  . No Known Allergies    Objective:   Vitals:   10/28/19 1431  BP: 126/74  Pulse: 95  Temp: 97.6 F (36.4 C)   There is no height or weight on file to calculate BMI. Constitutional Well developed. Well nourished.  Vascular Foot warm and well perfused. Capillary refill normal to all digits.   Neurologic Normal speech. Oriented to person, place, and time. Epicritic sensation to light touch grossly present bilaterally.  Dermatologic Skin healing well without signs of infection. Skin edges well coapted without signs of infection.  Orthopedic: Tenderness to palpation noted about the surgical site.   Radiographs: 2 views of skeletally mature adult noted: Hardware intact no signs of loosening or failure.  Good alignment of correction noted.  No loss of correction noted. Assessment:   1. Hammer toe of right foot   2. S/P foot surgery, right    Plan:  Patient was evaluated and treated and all questions answered.  S/p foot surgery right -Progressing as expected post-operatively. -XR: See above -WB Status: Nonweightbearing  in cast with knee scooter -Sutures: Did not examine as cast was not removed during today's visit.  We will remove the cast during next visit.. -Medications: None -Foot redressed.  No  follow-ups on file.

## 2019-10-30 ENCOUNTER — Encounter: Payer: BC Managed Care – PPO | Admitting: Podiatry

## 2019-11-04 ENCOUNTER — Encounter: Payer: BC Managed Care – PPO | Admitting: Podiatry

## 2019-11-05 ENCOUNTER — Other Ambulatory Visit: Payer: Self-pay

## 2019-11-05 ENCOUNTER — Ambulatory Visit (INDEPENDENT_AMBULATORY_CARE_PROVIDER_SITE_OTHER): Payer: BC Managed Care – PPO | Admitting: Podiatry

## 2019-11-05 DIAGNOSIS — M2041 Other hammer toe(s) (acquired), right foot: Secondary | ICD-10-CM

## 2019-11-05 DIAGNOSIS — M2011 Hallux valgus (acquired), right foot: Secondary | ICD-10-CM | POA: Diagnosis not present

## 2019-11-05 DIAGNOSIS — Z9889 Other specified postprocedural states: Secondary | ICD-10-CM

## 2019-11-06 ENCOUNTER — Encounter: Payer: Self-pay | Admitting: Podiatry

## 2019-11-06 NOTE — Progress Notes (Signed)
  Subjective:  Patient ID: Jordan Luna, male    DOB: 1961/07/08,  MRN: [id number]  Chief Complaint  Patient presents with  . Routine Post Op    Cast Change** pov#2 dos 11.20.2020 Lapidus Procedure Including Bunionectomy Rt, Metatarsal Osteotomy 2nd Rt, Hammertoe Repair 1,0,6 Rt, Cast Application Rt     58 y.o. male returns for post-op check.  He states that he is doing pretty well.  He does not have any pain to the surgical site.  He is a little anxious today for cast removal.  His cast was removed at bedside.  He denies any other acute complaints at this time.  Review of Systems: Negative except as noted in the HPI. Denies N/V/F/Ch.  No past medical history on file.  Current Outpatient Medications:  .  carbidopa-levodopa (SINEMET IR) 25-100 MG tablet, carbidopa 25 mg-levodopa 100 mg tablet  TAKE 1/2 TABLET BY MOUTH 3 TIMES A DAY FOR 3 DAYS, THEN TAKE 1 TABLET BY MOUTH 3 TIMES A DAY, Disp: , Rfl:  .  cephALEXin (KEFLEX) 500 MG capsule, Take 1 capsule (500 mg total) by mouth 3 (three) times daily., Disp: 30 capsule, Rfl: 0 .  ondansetron (ZOFRAN) 4 MG tablet, Take 1 tablet (4 mg total) by mouth every 8 (eight) hours as needed., Disp: 20 tablet, Rfl: 0 .  potassium citrate (UROCIT-K) 10 MEQ (1080 MG) SR tablet, Take by mouth., Disp: , Rfl:  .  meloxicam (MOBIC) 15 MG tablet, TAKE 1 TABLET BY MOUTH EVERY DAY, Disp: 90 tablet, Rfl: 1 .  POTASSIUM CHLORIDE ER PO, 2 Tabs by Misc.(Non-Drug; Combo Route) route twice a day. , Disp: , Rfl:  .  VIVOTIF DR capsule, TAKE 1 CAPSULE BY ORAL ROUTE EVERY OTHER DAY FOR 8 DAYS, Disp: , Rfl: 0 .  zolpidem (AMBIEN) 10 MG tablet, Take 10 mg by mouth at bedtime as needed., Disp: , Rfl: 0  Social History   Tobacco Use  Smoking Status Never Smoker  Smokeless Tobacco Never Used    Allergies  Allergen Reactions  . No Known Allergies    Objective:  There were no vitals filed for this visit. There is no height or weight on file to calculate BMI.  Constitutional Well developed. Well nourished.  Vascular Foot warm and well perfused. Capillary refill normal to all digits.   Neurologic Normal speech. Oriented to person, place, and time. Epicritic sensation to light touch grossly present bilaterally.  Dermatologic Skin healing well without signs of infection. Skin edges well coapted without signs of infection.  Orthopedic: Tenderness to palpation noted about the surgical site.   Radiographs: Radiographs were reviewed 2 views of skeletally mature adult foot.  The fixation hardware was in good alignment with good correction noted.  No signs of loosening or hardware failure noted. Assessment:   1. S/P foot surgery, right   2. Hallux valgus of right foot   3. Hammer toe of right foot    Plan:  Patient was evaluated and treated and all questions answered.  S/p foot surgery right -Progressing as expected post-operatively. -XR: See above -WB Status: Nonweightbearing in cast -Sutures: Intact.  The sutures were not ready to come out quite yet especially over the dorsal toes.  No clinical signs of infection or dehiscence noted. -Medications: None -Foot redressed. -Patient was placed back in a cast in a good position with well padding.  No follow-ups on file.

## 2019-11-13 ENCOUNTER — Encounter: Payer: BC Managed Care – PPO | Admitting: Podiatry

## 2019-11-14 NOTE — Telephone Encounter (Signed)
This encounter was created in error - please disregard.

## 2019-11-18 ENCOUNTER — Encounter: Payer: Self-pay | Admitting: Podiatry

## 2019-11-18 ENCOUNTER — Other Ambulatory Visit: Payer: Self-pay

## 2019-11-18 ENCOUNTER — Ambulatory Visit (INDEPENDENT_AMBULATORY_CARE_PROVIDER_SITE_OTHER): Payer: BC Managed Care – PPO | Admitting: Podiatry

## 2019-11-18 ENCOUNTER — Ambulatory Visit (INDEPENDENT_AMBULATORY_CARE_PROVIDER_SITE_OTHER): Payer: BC Managed Care – PPO

## 2019-11-18 DIAGNOSIS — M2041 Other hammer toe(s) (acquired), right foot: Secondary | ICD-10-CM | POA: Diagnosis not present

## 2019-11-18 DIAGNOSIS — M2011 Hallux valgus (acquired), right foot: Secondary | ICD-10-CM

## 2019-11-18 DIAGNOSIS — Z9889 Other specified postprocedural states: Secondary | ICD-10-CM

## 2019-11-18 MED ORDER — MELOXICAM 15 MG PO TABS: 15.0000 mg | ORAL_TABLET | Freq: Every day | ORAL | 4 refills | Status: AC

## 2019-11-18 NOTE — Progress Notes (Signed)
He presents today for her third postop visit date of surgery October 18, 2019 status post Lapidus procedure second and third metatarsal osteotomies hammertoe repair #2 #3 #4 all on the right foot.  Presents for cast removal today.  Objective: Vital signs are stable he is alert and oriented x3.  Cast was removed pulses are palpable incision site is gone on to heal uneventfully.  Sutures were removed.  Radiographs taken today demonstrate well healing surgical foot.  Assessment: Well-healing surgical foot right.  Plan: Placed him in a compression dressing today and he will continue the use of Cam walker.  I will follow-up with him in 2 weeks.  He is to continue nonweightbearing and in 2 weeks we will remove the pins.

## 2019-11-18 NOTE — Telephone Encounter (Signed)
Pharmacy refill request for Meloxicam 15mg.  Per Dr. Hyatt's verbal order, ok to refill.  Script has been sent to pharmacy 

## 2019-11-27 ENCOUNTER — Encounter: Payer: BC Managed Care – PPO | Admitting: Podiatry

## 2019-12-02 ENCOUNTER — Encounter: Payer: BC Managed Care – PPO | Admitting: Podiatry

## 2019-12-04 ENCOUNTER — Ambulatory Visit (INDEPENDENT_AMBULATORY_CARE_PROVIDER_SITE_OTHER): Payer: BC Managed Care – PPO

## 2019-12-04 ENCOUNTER — Ambulatory Visit (INDEPENDENT_AMBULATORY_CARE_PROVIDER_SITE_OTHER): Payer: BC Managed Care – PPO | Admitting: Podiatry

## 2019-12-04 ENCOUNTER — Other Ambulatory Visit: Payer: Self-pay

## 2019-12-04 DIAGNOSIS — M2011 Hallux valgus (acquired), right foot: Secondary | ICD-10-CM

## 2019-12-04 DIAGNOSIS — M2041 Other hammer toe(s) (acquired), right foot: Secondary | ICD-10-CM

## 2019-12-04 DIAGNOSIS — Z9889 Other specified postprocedural states: Secondary | ICD-10-CM

## 2019-12-04 NOTE — Progress Notes (Signed)
He presents today date of surgery 10/18/2019 status post Lapidus procedure second metatarsal osteotomy with hammertoe repair #2 #3 #4 of the right foot.  He denies fever chills nausea vomiting muscle aches and pain states that seems to be doing well.  Objective: No signs stable he is alert and oriented x3 there is minimal edema no erythema cellulitis drainage or odor.  K wires are intact to toes #2 3 and #4 of the right foot.  Radiographs taken today demonstrate well healing fusion sites.  Internal fixation is in good position.  Assessment: Well-healing surgical foot right.  Plan: Remove the K wires today and will allow him to start partial weightbearing with the boot and I will to follow-up with him in about 2 weeks at which time we will progress to full weightbearing possibly Darco.

## 2019-12-18 ENCOUNTER — Ambulatory Visit (INDEPENDENT_AMBULATORY_CARE_PROVIDER_SITE_OTHER): Payer: BC Managed Care – PPO | Admitting: Podiatry

## 2019-12-18 ENCOUNTER — Ambulatory Visit (INDEPENDENT_AMBULATORY_CARE_PROVIDER_SITE_OTHER): Payer: BC Managed Care – PPO

## 2019-12-18 ENCOUNTER — Other Ambulatory Visit: Payer: Self-pay

## 2019-12-18 ENCOUNTER — Encounter: Payer: Self-pay | Admitting: Podiatry

## 2019-12-18 DIAGNOSIS — Z9889 Other specified postprocedural states: Secondary | ICD-10-CM

## 2019-12-18 DIAGNOSIS — M2041 Other hammer toe(s) (acquired), right foot: Secondary | ICD-10-CM

## 2019-12-18 DIAGNOSIS — M2011 Hallux valgus (acquired), right foot: Secondary | ICD-10-CM

## 2019-12-18 NOTE — Progress Notes (Signed)
He presents today 2 months status post fusion of the first TMT J right foot second met osteotomy hammertoe repair #2 #3 #4 with pins.  He states that he is doing just fine.  Continues to wear his cam walker and compression anklet at all times.  Denies fever chills nausea vomiting muscle aches pains calf pain back pain chest pain shortness of breath.  Objective: Vital signs are stable he is alert and oriented x3.  Pulses are palpable.  There is no erythema he does have some moderate edema no cellulitis drainage or odor incision site is gone on to heal uneventfully.  Has some mild tenderness on direct palpation of the medial aspect and medial plantar aspect of the first TMT J.  Radiographs taken today demonstrate well healing Lapidus fusion and digital fusions.  No motion of the internal fixation.  Assessment: Well-healing surgical foot right.  Plan: I am put him in a Darco shoe encouraged him to discontinue the compression anklet unless absolutely needed and I want him to progress to a normal tennis shoe.  I will follow-up with him in 1 month for another set of x-rays.  Paper radiograph copies were provided to him today.

## 2020-01-02 ENCOUNTER — Encounter: Payer: Self-pay | Admitting: Podiatry

## 2020-01-02 ENCOUNTER — Other Ambulatory Visit: Payer: Self-pay

## 2020-01-02 ENCOUNTER — Ambulatory Visit (INDEPENDENT_AMBULATORY_CARE_PROVIDER_SITE_OTHER): Payer: BC Managed Care – PPO | Admitting: Podiatry

## 2020-01-02 DIAGNOSIS — M2041 Other hammer toe(s) (acquired), right foot: Secondary | ICD-10-CM

## 2020-01-02 DIAGNOSIS — Z9889 Other specified postprocedural states: Secondary | ICD-10-CM

## 2020-01-02 DIAGNOSIS — M2011 Hallux valgus (acquired), right foot: Secondary | ICD-10-CM

## 2020-01-02 DIAGNOSIS — R601 Generalized edema: Secondary | ICD-10-CM

## 2020-01-03 ENCOUNTER — Encounter: Payer: Self-pay | Admitting: Podiatry

## 2020-01-03 NOTE — Progress Notes (Signed)
Subjective:  Patient ID: Jordan Luna, male    DOB: 11-30-1960,  MRN: [id number]  Chief Complaint  Patient presents with  . Post-op Problem    Patient presents today for coninued swelling and redness to surgical foot, now redness, swelling is moving up leg to knee.  He denies any pain, only tightness    59 y.o. male presents with the above complaint.  Patient presents complaints of generalized edema with little bit of redness to the right lower extremity.  Patient states that he had surgery done by Dr. Laureen Ochs with Lapidus with multiple hammertoe corrections.  He states that that was doing just fine.  But he is concerned for this swelling with erythema.  Patient states that there is no pain associated with it.  He was little bit worried about the edema going up to the mid leg.  He just wanted to make sure that there was nothing acute going on.  He denies any other acute complaints.   Review of Systems: Negative except as noted in the HPI. Denies N/V/F/Ch.  No past medical history on file.  Current Outpatient Medications:  .  carbidopa-levodopa (SINEMET IR) 25-100 MG tablet, carbidopa 25 mg-levodopa 100 mg tablet  TAKE 1/2 TABLET BY MOUTH 3 TIMES A DAY FOR 3 DAYS, THEN TAKE 1 TABLET BY MOUTH 3 TIMES A DAY, Disp: , Rfl:  .  meloxicam (MOBIC) 15 MG tablet, TAKE 1 TABLET BY MOUTH EVERY DAY, Disp: 90 tablet, Rfl: 1 .  meloxicam (MOBIC) 15 MG tablet, Take 1 tablet (15 mg total) by mouth daily., Disp: 30 tablet, Rfl: 4 .  POTASSIUM CHLORIDE ER PO, 2 Tabs by Misc.(Non-Drug; Combo Route) route twice a day. , Disp: , Rfl:  .  potassium citrate (UROCIT-K) 10 MEQ (1080 MG) SR tablet, Take by mouth., Disp: , Rfl:  .  VIVOTIF DR capsule, TAKE 1 CAPSULE BY ORAL ROUTE EVERY OTHER DAY FOR 8 DAYS, Disp: , Rfl: 0 .  zolpidem (AMBIEN) 10 MG tablet, Take 10 mg by mouth at bedtime as needed., Disp: , Rfl: 0  Social History   Tobacco Use  Smoking Status Never Smoker  Smokeless Tobacco Never Used     Allergies  Allergen Reactions  . No Known Allergies    Objective:  There were no vitals filed for this visit. There is no height or weight on file to calculate BMI. Constitutional Well developed. Well nourished.  Vascular Dorsalis pedis pulses palpable bilaterally. Posterior tibial pulses palpable bilaterally. Capillary refill normal to all digits.  No cyanosis or clubbing noted. Pedal hair growth normal.  Neurologic Normal speech. Oriented to person, place, and time. Epicritic sensation to light touch grossly present bilaterally.  Dermatologic Nails well groomed and normal in appearance. No open wounds. No skin lesions.  Orthopedic:  2+ pitting edema noted to the right lower extremity.  No pain on palpation to anywhere in the lower extremity including the surgical site.  The surgical site has completely reepithelialized.   Radiographs: None Assessment:   1. S/P foot surgery, right   2. Hammer toe of right foot   3. Hallux valgus of right foot    Plan:  Patient was evaluated and treated and all questions answered.  Right lower extremity generalized edema -I explained to the patient the etiology of edema and various treatment options associated with it.  I have asked the patient to aggressively elevate where his foot is above the level of the heart as this will give the best regression and edema.  I also believe patient will benefit from aggressive ankle range of motion exercises which I have instructed and educated him on.  Even though patient may not benefit from compression socks in the long-term I believe he may benefit for them now as elevation has not been enough.  Patient states understanding and will follow instructions accordingly. -Patient will be scheduled see Dr. Al Corpus for further evaluation and management.  No follow-ups on file.

## 2020-01-22 ENCOUNTER — Ambulatory Visit (INDEPENDENT_AMBULATORY_CARE_PROVIDER_SITE_OTHER): Payer: BC Managed Care – PPO | Admitting: Podiatry

## 2020-01-22 ENCOUNTER — Encounter: Payer: Self-pay | Admitting: Podiatry

## 2020-01-22 ENCOUNTER — Ambulatory Visit (INDEPENDENT_AMBULATORY_CARE_PROVIDER_SITE_OTHER): Payer: BC Managed Care – PPO

## 2020-01-22 ENCOUNTER — Other Ambulatory Visit: Payer: Self-pay

## 2020-01-22 DIAGNOSIS — M2011 Hallux valgus (acquired), right foot: Secondary | ICD-10-CM | POA: Diagnosis not present

## 2020-01-22 DIAGNOSIS — M2041 Other hammer toe(s) (acquired), right foot: Secondary | ICD-10-CM | POA: Diagnosis not present

## 2020-01-22 DIAGNOSIS — Z9889 Other specified postprocedural states: Secondary | ICD-10-CM

## 2020-01-22 NOTE — Progress Notes (Signed)
He presents today date of surgery October 18, 2019 status post Lapidus procedure including bunionectomy second metatarsal osteotomy hammertoe repair #2 #3 #4 the right foot.  States that is about 80% better I feel like I am doing really well with it.  He states that is been a little swollen as of been a little more active on it however I know how to take care of that now.  Objective: Vital signs are stable he is alert and oriented x3.  There is minimal edema about the foot no reproducible pain.  Pulses are strongly palpable.  No pitting edema is noted.  Radiographs demonstrate healing first metatarsal medial cuneiform joint.  There is some plantar diastases but minimally so.  Does not appear to be much increased from previous eval.  Assessment: Slowly healing Lapidus procedure status post significant osteoarthritic change.  Plan: He will continue current therapies keeping his foot elevated is much as he can and limiting his ambulation to minimum.

## 2020-03-04 ENCOUNTER — Encounter: Payer: Self-pay | Admitting: Podiatry

## 2020-03-04 ENCOUNTER — Other Ambulatory Visit: Payer: Self-pay

## 2020-03-04 ENCOUNTER — Ambulatory Visit (INDEPENDENT_AMBULATORY_CARE_PROVIDER_SITE_OTHER): Payer: BC Managed Care – PPO

## 2020-03-04 ENCOUNTER — Ambulatory Visit (INDEPENDENT_AMBULATORY_CARE_PROVIDER_SITE_OTHER): Payer: BC Managed Care – PPO | Admitting: Podiatry

## 2020-03-04 VITALS — Temp 96.4°F

## 2020-03-04 DIAGNOSIS — M2041 Other hammer toe(s) (acquired), right foot: Secondary | ICD-10-CM | POA: Diagnosis not present

## 2020-03-04 DIAGNOSIS — M2011 Hallux valgus (acquired), right foot: Secondary | ICD-10-CM | POA: Diagnosis not present

## 2020-03-04 DIAGNOSIS — Z9889 Other specified postprocedural states: Secondary | ICD-10-CM

## 2020-03-04 NOTE — Progress Notes (Signed)
He presents today for follow-up of his Lapidus procedure and hammertoe repair.  He states that he is doing very well and feels that he is 92.3% improved..  With improvement from 80% last time.  Objective: Vital signs are stable he is alert and oriented x3.  No reproducible pain on palpation surgical foot does not demonstrate edema no erythema cellulitis drainage or odor incision site is completely healed.  Assessment: Well-healing surgical foot.  Plan: Follow-up with me on as-needed basis slowly increase his activity over time.
